# Patient Record
Sex: Female | Born: 1995 | Race: White | Hispanic: No | Marital: Single | Smoking: Current every day smoker
Health system: Southern US, Community
[De-identification: ages and names within clinical notes are randomized; demographics above are authoritative.]

## PROBLEM LIST (undated history)

## (undated) DIAGNOSIS — Z789 Other specified health status: Secondary | ICD-10-CM

## (undated) HISTORY — PX: NO PAST SURGERIES: SHX2092

## (undated) HISTORY — DX: Other specified health status: Z78.9

---
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
## 2021-11-22 ENCOUNTER — Inpatient Hospital Stay
Admit: 2021-11-22 | Discharge: 2021-11-22 | Disposition: A | Discharge status: Home / Self Care | Payer: MEDICAID | Attending: Emergency Medicine

## 2021-11-22 DIAGNOSIS — R519 Headache, unspecified: Secondary | ICD-10-CM

## 2021-11-22 LAB — RAPID INFLUENZA A/B ANTIGENS
Rapid Influenza A Ag: NEGATIVE
Rapid Influenza B Ag: NEGATIVE

## 2021-11-22 LAB — COVID-19, RAPID: SARS-CoV-2, NAAT: NOT DETECTED

## 2021-11-22 MED ORDER — KETOROLAC TROMETHAMINE 10 MG PO TABS
10 MG | ORAL_TABLET | Freq: Four times a day (QID) | ORAL | 0 refills | Status: AC | PRN
Start: 2021-11-22 — End: 2022-11-22

## 2021-11-22 MED ORDER — PROCHLORPERAZINE EDISYLATE 10 MG/2ML IJ SOLN
10 MG/2ML | Freq: Once | INTRAMUSCULAR | Status: AC
Start: 2021-11-22 — End: 2021-11-22
  Administered 2021-11-22: 19:00:00 10 mg via INTRAVENOUS

## 2021-11-22 MED ORDER — KETOROLAC TROMETHAMINE 30 MG/ML IJ SOLN
30 MG/ML | Freq: Once | INTRAMUSCULAR | Status: AC
Start: 2021-11-22 — End: 2021-11-22
  Administered 2021-11-22: 19:00:00 15 mg via INTRAVENOUS

## 2021-11-22 MED FILL — PROCHLORPERAZINE EDISYLATE 10 MG/2ML IJ SOLN: 10 MG/2ML | INTRAMUSCULAR | Qty: 2

## 2021-11-22 NOTE — ED Notes (Signed)
Discharge instructions provided to patient. PIV removed. Patient verbalizes understanding of d/c plan and follow up care. Patient ambulatory off unit to POV at this time with no further needs noted.      Dixon Boos, RN  11/22/21 1601

## 2021-11-22 NOTE — ED Provider Notes (Signed)
Marland Kitchen        [place] ENCOUNTER        Pt Name: Carrie Sutton  MRN: [id number]  [place] February 17, 1995  Date of evaluation: 11/22/2021  Provider: Zachery Conch, MD  PCP: Fara Olden  Note Started: 2:15 PM EDT 11/22/21    CHIEF COMPLAINT       Chief Complaint   Patient presents with    Fever       HISTORY OF PRESENT ILLNESS: 1 or more Elements     History from : Patient    Limitations to history : None    Suella Campbell-Hughes is a 26 y.o. female who presents planing of pressure stiffness in her upper thoracic back neck area radiating into her head she is also had a low-grade temp of 100 100.5 associated with this this started about 6 days ago she was seen seen 5 days ago by her primary care and given steroids and an antibiotic which she finished 2 days ago she initially felt a little bit better but now her symptoms have come back she denies any ingestion or any other symptoms and states that this kind of feels like her my does not have a long time except for the low-grade fever    Nursing Notes were all reviewed and agreed with or any disagreements were addressed in the HPI.    REVIEW OF SYSTEMS :      Review of Systems     systems reviewed and negative except as in HPI/MDM    SURGICAL HISTORY   History reviewed. No pertinent surgical history.    CURRENTMEDICATIONS       Previous Medications    No medications on file       ALLERGIES     Patient has no known allergies.    FAMILYHISTORY     History reviewed. No pertinent family history.     SOCIAL HISTORY       Social History     Tobacco Use    Smoking status: Never    Smokeless tobacco: Never   Vaping Use    Vaping Use: Never used   Substance Use Topics    Alcohol use: Never    Drug use: Never       SCREENINGS        Glasgow Coma Scale  Eye Opening: Spontaneous  Best Verbal Response: Oriented  Best Motor Response: Obeys commands  Glasgow Coma Scale Score: 15                CIWA Assessment  BP:  105/70  Pulse: 95           PHYSICAL EXAM  1 or more Elements     ED Triage Vitals [11/22/21 1339]   BP Temp Temp Source Pulse Respirations SpO2 Height Weight - Scale   105/70 97.8 F (36.6 C) Oral 95 18 100 % 1.575 m (5\' 2" ) 59.9 kg (132 lb)       Physical Exam    My pulse oximetry interpretation is 100%which is within the normal range    GENERAL APPEARANCE: Awake and alert. Cooperative. No acute distress.  HEAD:  Atraumatic.  EYES: EOM's grossly intact.   ENT: Mucous membranes are moist.  No trismus.  NECK:  Trachea midline.  HEART: Regular rate and rhythm  LUNGS: Respirations unlabored. CTAB  ABDOMEN: Soft. Non-tender. No guarding or rebound.  EXTREMITIES: No acute deformities.  SKIN: Warm and dry.  NEUROLOGICAL: Moves all  4 extremities spontaneously.  PSYCHIATRIC: Normal mood.        DIAGNOSTIC RESULTS   LABS:    Labs Reviewed   COVID-19, RAPID   RAPID INFLUENZA A/B ANTIGENS       When ordered only abnormal lab results are displayed. All other labs were within normal range or not returned as of this dictation.    EKG:     RADIOLOGY:   Non-plain film images such as CT, Ultrasound and MRI are read by the radiologist. Plain radiographic images are visualized and preliminarily interpreted by the ED Provider with the below findings:        Interpretation per the Radiologist below, if available at the time of this note:    No orders to display     No results found.    No results found.    PROCEDURES   Unless otherwise noted below, none     Procedures    CRITICAL CARE TIME       EMERGENCY DEPARTMENT COURSE and DIFFERENTIAL DIAGNOSIS/MDM:   Vitals:    Vitals:    11/22/21 1339   BP: 105/70   Pulse: 95   Resp: 18   Temp: 97.8 F (36.6 C)   TempSrc: Oral   SpO2: 100%   Weight: 59.9 kg (132 lb)   Height: 1.575 m (5\' 2" )       Patient was given the following medications:  Medications   ketorolac (TORADOL) injection 15 mg (15 mg IntraVENous Given 11/22/21 1504)   prochlorperazine (COMPAZINE) injection 10 mg (10 mg  IntraVENous Given 11/22/21 1513)             Is this patient to be included in the SEP-1 Core Measure due to severe sepsis or septic shock?   No   Exclusion criteria - the patient is NOT to be included for SEP-1 Core Measure due to:  Alternative explanation for abnormal labs/vitals that do not relate to sepsis, see MDM for further explanation    PAST MEDICAL HISTORY      has no past medical history on file.     CC/HPI Summary, DDx, ED Course, and Reassessment: 1540    Patient is feeling better starting to feel sleepy.  Her COVID and flu are negative this seems to be a migraine headache.  She has not had any in quite a while.  At this time we will discharge her to home give her prescription for Toradol for headache at home and have her follow-up with her family physician in the next 4 to 5 days.    CONSULTS: (Who and What was discussed)  None            Chronic Conditions: Migraines        Disposition Considerations (include 1 Tests not done, Shared Decision Making, Pt Expectation of Test or Tx.):         I am the Primary Clinician of Record.    FINAL IMPRESSION      1. Nonintractable headache, unspecified chronicity pattern, unspecified headache type          DISPOSITION/PLAN     DISPOSITION Decision To Discharge 11/22/2021 03:41:21 PM  Improved discharge to home    PATIENT REFERRED TO:  11/24/2021 [address]  [place]  [phone]    Schedule an appointment as soon as possible for a visit in 3 days        DISCHARGE MEDICATIONS:  New Prescriptions    KETOROLAC (TORADOL) 10 MG  TABLET    Take 1 tablet by mouth every 6 hours as needed for Pain       DISCONTINUED MEDICATIONS:  Discontinued Medications    No medications on file              (Please note that portions of this note were completed with a voice recognition program.  Efforts were made to edit the dictations but occasionally words are mis-transcribed.)    Ancil Linsey, MD (electronically signed)           Ancil Linsey, MD  11/22/21  [phone]

## 2021-11-22 NOTE — ED Triage Notes (Signed)
Pt she saw her PCP Monday last week. They gave ABX and steroids. Finished meds with no improvement.
